# Patient Record
Sex: Female | Born: 1935 | Race: White | Hispanic: No | Marital: Married | Smoking: Never smoker
Health system: Southern US, Community
[De-identification: ages and names within clinical notes are randomized; demographics above are authoritative.]

## PROBLEM LIST (undated history)

## (undated) DIAGNOSIS — F329 Major depressive disorder, single episode, unspecified: Secondary | ICD-10-CM

## (undated) DIAGNOSIS — F32A Depression, unspecified: Secondary | ICD-10-CM

## (undated) DIAGNOSIS — M797 Fibromyalgia: Secondary | ICD-10-CM

## (undated) DIAGNOSIS — I639 Cerebral infarction, unspecified: Secondary | ICD-10-CM

## (undated) DIAGNOSIS — I209 Angina pectoris, unspecified: Secondary | ICD-10-CM

## (undated) DIAGNOSIS — D649 Anemia, unspecified: Secondary | ICD-10-CM

## (undated) DIAGNOSIS — G8929 Other chronic pain: Secondary | ICD-10-CM

## (undated) HISTORY — DX: Fibromyalgia: M79.7

## (undated) HISTORY — PX: OTHER SURGICAL HISTORY: SHX169

## (undated) HISTORY — PX: HERNIA REPAIR: SHX51

## (undated) HISTORY — PX: APPENDECTOMY: SHX54

## (undated) HISTORY — DX: Other chronic pain: G89.29

## (undated) HISTORY — DX: Angina pectoris, unspecified: I20.9

## (undated) HISTORY — DX: Anemia, unspecified: D64.9

## (undated) HISTORY — PX: BLADDER REPAIR: SHX76

## (undated) HISTORY — DX: Major depressive disorder, single episode, unspecified: F32.9

## (undated) HISTORY — DX: Depression, unspecified: F32.A

## (undated) HISTORY — DX: Cerebral infarction, unspecified: I63.9

---

## 2002-11-30 ENCOUNTER — Ambulatory Visit (HOSPITAL_COMMUNITY): Admission: RE | Admit: 2002-11-30 | Discharge: 2002-11-30 | Payer: Self-pay | Admitting: Internal Medicine

## 2003-04-09 ENCOUNTER — Ambulatory Visit (HOSPITAL_COMMUNITY): Admission: RE | Admit: 2003-04-09 | Discharge: 2003-04-09 | Payer: Self-pay | Admitting: Internal Medicine

## 2003-10-11 ENCOUNTER — Ambulatory Visit (HOSPITAL_COMMUNITY): Admission: RE | Admit: 2003-10-11 | Discharge: 2003-10-11 | Payer: Self-pay | Admitting: Internal Medicine

## 2004-05-05 ENCOUNTER — Ambulatory Visit (HOSPITAL_COMMUNITY): Admission: RE | Admit: 2004-05-05 | Discharge: 2004-05-05 | Payer: Self-pay | Admitting: Internal Medicine

## 2004-07-27 HISTORY — PX: ESOPHAGOGASTRODUODENOSCOPY: SHX1529

## 2004-09-09 ENCOUNTER — Ambulatory Visit (HOSPITAL_COMMUNITY): Admission: RE | Admit: 2004-09-09 | Discharge: 2004-09-09 | Payer: Self-pay | Admitting: Internal Medicine

## 2004-09-09 ENCOUNTER — Ambulatory Visit: Payer: Self-pay | Admitting: Internal Medicine

## 2008-10-11 ENCOUNTER — Ambulatory Visit: Payer: Self-pay | Admitting: Internal Medicine

## 2008-10-11 ENCOUNTER — Encounter (INDEPENDENT_AMBULATORY_CARE_PROVIDER_SITE_OTHER): Payer: Self-pay | Admitting: *Deleted

## 2008-10-11 DIAGNOSIS — G8929 Other chronic pain: Secondary | ICD-10-CM

## 2008-10-11 DIAGNOSIS — F329 Major depressive disorder, single episode, unspecified: Secondary | ICD-10-CM

## 2008-10-11 DIAGNOSIS — K5900 Constipation, unspecified: Secondary | ICD-10-CM | POA: Insufficient documentation

## 2008-10-11 DIAGNOSIS — IMO0001 Reserved for inherently not codable concepts without codable children: Secondary | ICD-10-CM | POA: Insufficient documentation

## 2008-10-11 DIAGNOSIS — I6350 Cerebral infarction due to unspecified occlusion or stenosis of unspecified cerebral artery: Secondary | ICD-10-CM | POA: Insufficient documentation

## 2008-10-11 DIAGNOSIS — D649 Anemia, unspecified: Secondary | ICD-10-CM

## 2008-10-11 DIAGNOSIS — R1310 Dysphagia, unspecified: Secondary | ICD-10-CM | POA: Insufficient documentation

## 2008-10-11 DIAGNOSIS — Z8679 Personal history of other diseases of the circulatory system: Secondary | ICD-10-CM | POA: Insufficient documentation

## 2008-10-11 DIAGNOSIS — A0472 Enterocolitis due to Clostridium difficile, not specified as recurrent: Secondary | ICD-10-CM

## 2008-10-19 ENCOUNTER — Telehealth (INDEPENDENT_AMBULATORY_CARE_PROVIDER_SITE_OTHER): Payer: Self-pay | Admitting: *Deleted

## 2008-10-23 ENCOUNTER — Encounter: Payer: Self-pay | Admitting: Internal Medicine

## 2008-11-21 ENCOUNTER — Ambulatory Visit: Payer: Self-pay | Admitting: Internal Medicine

## 2008-11-22 DIAGNOSIS — K219 Gastro-esophageal reflux disease without esophagitis: Secondary | ICD-10-CM | POA: Insufficient documentation

## 2009-10-11 ENCOUNTER — Encounter (INDEPENDENT_AMBULATORY_CARE_PROVIDER_SITE_OTHER): Payer: Self-pay | Admitting: *Deleted

## 2010-05-02 ENCOUNTER — Ambulatory Visit: Payer: Self-pay | Admitting: Internal Medicine

## 2010-05-07 ENCOUNTER — Ambulatory Visit: Payer: Self-pay | Admitting: Internal Medicine

## 2010-05-08 DIAGNOSIS — Z8719 Personal history of other diseases of the digestive system: Secondary | ICD-10-CM

## 2010-05-12 ENCOUNTER — Encounter: Payer: Self-pay | Admitting: Internal Medicine

## 2010-05-23 ENCOUNTER — Ambulatory Visit (HOSPITAL_COMMUNITY): Admission: RE | Admit: 2010-05-23 | Discharge: 2010-05-23 | Payer: Self-pay | Admitting: Internal Medicine

## 2010-05-23 ENCOUNTER — Ambulatory Visit: Payer: Self-pay | Admitting: Internal Medicine

## 2010-06-18 ENCOUNTER — Ambulatory Visit: Payer: Self-pay | Admitting: Internal Medicine

## 2010-08-12 ENCOUNTER — Encounter: Payer: Self-pay | Admitting: Urgent Care

## 2010-08-26 ENCOUNTER — Encounter (INDEPENDENT_AMBULATORY_CARE_PROVIDER_SITE_OTHER): Payer: Self-pay | Admitting: *Deleted

## 2010-08-26 NOTE — Letter (Signed)
Summary: TCS ORDER  TCS ORDER   Imported By: Ave Filter 05/12/2010 11:06:45  _____________________________________________________________________  External Attachment:    Type:   Image     Comment:   External Document

## 2010-08-26 NOTE — Letter (Signed)
Summary: [facility]  [facility]  [address]   [place], [place] [address]   Phone: [phone]  Fax: [phone]    10/11/2009  [facility] [address] [place], [place]  [address] [birth year]  Dear Ms. Cabezas,   Your physician has indicated that:   _______it is time to schedule an appointment.   _______you missed your appointment on______ and need to call and  reschedule.   _______you need to have lab work done.   _______you need to schedule an appointment to discuss lab or test results.   _______you need to call to reschedule your appointment that was scheduled on _________.   Please call our office at  [phone].    Thank you,    [facility] Ph: [phone]   Fax: [phone]

## 2010-08-26 NOTE — Assessment & Plan Note (Signed)
Summary: HAVING BOWEL PROBLEMS/LAW   Visit Type:  Initial Visit Primary Care Johntavious Francom:  Shelia [name]  Chief Complaint:  constipation.  History of Present Illness: Followup chronic constipation. Last seen a little over a one year ago. Has been maintained on MiraLax. Polypharmacy is contributing to constipation. Since I last saw this nice lady, she underwent revision/repair of her ventral hernia and mesh removed up in [place]. She's doing well from that procedure. When she gets constipated may go  2-3 or 4 days without a bowel movement before  taking MiraLax takes it for few days; has  a "blowout" /  diarrhea.  She has had no  rectal bleeding last colonoscopy 2005; at that time,  she had an evaluation for diarrhea found have C. difficile and left-sided diverticula. No history of colon polyps or colon cancer in her family. She due for routine screening 2015. Has any upper GI tract symptoms such as reflux nausea vomiting or abdominal pain.  Current Problems (verified): 1)  Gerd  (ICD-530.81) 2)  Fibromyalgia  (ICD-729.1) 3)  Hx of Cva  (ICD-434.91) 4)  Other Chronic Pain  (ICD-338.29) 5)  Depression  (ICD-311) 6)  Angina, Hx of  (ICD-V12.50) 7)  Anemia  (ICD-285.9) 8)  Constipation  (ICD-564.00) 9)  Dysphagia Unspecified  (ICD-787.20) 10)  Clostridium Difficile Colitis  (ICD-008.45)  Current Medications (verified): 1)  Bayer Aspirin 325 Mg .... Once Daily 2)  Clonazepam Odt 0.5 Mg Tbdp (Clonazepam) .... Take 1/2 Tablet in The Am and 2 Tablets in The Pm 3)  Amitriptyline Hcl 100 Mg Tabs (Amitriptyline Hcl) .... At Bedtime 4)  Synthroid 88 Mcg Tabs (Levothyroxine Sodium) .... Once Daily 5)  B 12 Injection .... Once Monthly 6)  Methadone Hcl 10 Mg Tabs (Methadone Hcl) .... Three Times A Day 7)  Pravachol 40 Mg Tabs (Pravastatin Sodium) .... Once Daily 8)  Enalapril Maleate 5 Mg Tabs (Enalapril Maleate) .... Once Daily 9)  Meloxicam 15 Mg Tabs (Meloxicam) .... Two Times A Day 10)   Voltaren 1 % Gel (Diclofenac Sodium) .... Four Times A Day 11)  Miralax .... As Needed 12)  Zantac 150 Mg Tabs (Ranitidine Hcl) .... Once Daily  Allergies (verified): 1)  ! Prozac 2)  ! Nubain 3)  ! Vioxx 4)  ! Lyrica 5)  ! 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  Alcohol Use - yes  Risk Factors: Smoking Status: never (October 25, 2008)  Past Medical History: Anemia Angina Depression Chronic Pain 2 minor CVA'S Fibromyalgia egd 2006  Vital Signs:  Patient profile:   75 year old female Height:      64 inches Weight:      153 pounds BMI:     26.36 Temp:     98.2 degrees F oral Pulse rate:   64 / minute BP sitting:   110 / 70  (left arm) Cuff size:   regular  Vitals Entered By: Hendricks Limes LPN (May 02, 2010 9:42 AM)  Physical Exam  General:  alert conversant no acute distress couldn't be by her husband. Abdomen:  flat well-healed midline surgical scar positive bowel sounds soft nontender without appreciable mass or organomegaly  Impression & Recommendations: Impression: 75 year old lady with chronic  constipation the setting of polypharmacy. The way she's taking MiraLax needs to be adjusted so she will have fewer wide swings in bowel function.  Recommendations: MiraLax 17 g orally nightly on any given day without a bowel movement; if she has a bowel movement on any given day she should leave at MiraLax off.. She is to keep a stool diary. This does not improve bowel function within the next month she is to let me know. Otherwise, we'll plan see her on a p.r.n. basis.  Scheduled routine screening colonoscopy  2015.  We'll also have her return one fecal occult blood test sample.  Appended Document: Orders Update    Clinical Lists Changes  Problems: Added new problem of DIVERTICULITIS, HX OF (ICD-V12.79) Orders: Added new Service order of Est. Patient Level III (60630) - Signed

## 2010-08-26 NOTE — Assessment & Plan Note (Signed)
Summary: 4WK F/U PER RMR/LAW   Visit Type:  Follow-up Visit Primary Care Provider:  Majel Homer  Chief Complaint:  F/U.  History of Present Illness: Followup. Chronic constipation/obstipation. Recent colonoscopy demonstrated a long tortuous colon with left-sided diverticula. We started Amitiza  micrograms p.o. B.i.d..  She had 6 bowel movements last week; took one dose of MiraLax and som prune juice once. Feels her bowels finally started to move about 2 weeks ago.. No side effects whatsoever with Amitiza at this point in time. She is very pleased with herprogress.     Current Medications (verified): 1)  Bayer Aspirin 325 Mg .... Once Daily 2)  Clonazepam Odt 0.5 Mg Tbdp (Clonazepam) .... Take 1/2 Tablet in The Am and 2 Tablets in The Pm 3)  Amitriptyline Hcl 100 Mg Tabs (Amitriptyline Hcl) .... At Bedtime 4)  Synthroid 88 Mcg Tabs (Levothyroxine Sodium) .... Once Daily 5)  B 12 Injection .... Once Monthly 6)  Methadone Hcl 10 Mg Tabs (Methadone Hcl) .... Three Times A Day 7)  Pravachol 40 Mg Tabs (Pravastatin Sodium) .... Once Daily 8)  Enalapril Maleate 5 Mg Tabs (Enalapril Maleate) .... Once Daily 9)  Meloxicam 15 Mg Tabs (Meloxicam) .... Two Times A Day 10)  Voltaren 1 % Gel (Diclofenac Sodium) .... Four Times A Day 11)  Miralax .... As Needed 12)  Zantac 150 Mg Tabs (Ranitidine Hcl) .... Once Daily 13)  Zolpidem Tartrate 5 Mg Tabs (Zolpidem Tartrate) .... Take 1 Tab By Mouth At Bedtime 14)  Tizanedine 2mg  .... Take 1 Tablet By Mouth Once A Day 15)  Nitrostat 0.4 Mg Subl (Nitroglycerin) .... As Needed 16)  Amitiza 8 Mcg Caps (Lubiprostone) .... Take 1 Tablet By Mouth Two Times A Day  Allergies (verified): 1)  ! Prozac 2)  ! Nubain 3)  ! Vioxx 4)  ! Lyrica 5)  ! 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 We have now made major strides in combating her constipation. We do have some room to increase the  dose of Amitiza but will see how she does over the next 2 weeks contemplating a change in her current regimen. Recommendations: Continue Amitiza 8 micrograms orally b.i.d. during meals. May use MiraLax on top of Amitiza if needed.  2 week telephone progress report. Otherwise, plan see this nice lady back in 3 months.  Appended Document: Orders Update  Clinical Lists Changes  Orders: Added new Service order of Est. Patient Level III (40102) - Signed

## 2010-08-26 NOTE — Assessment & Plan Note (Signed)
Summary: dropped off stool/ss   Allergies: 1)  ! Prozac 2)  ! Nubain 3)  ! Vioxx 4)  ! Lyrica 5)  ! * Cymbalta  Other Orders: Immuno-chemical Fecal Occult (40981)   One iFOBT returned and it was positive.  Appended Document: dropped off stool/ss pt needs a tcs now for positive ifobt  Appended Document: dropped off stool/ss Pt scheduled for 05/23/10@1 :15..Pt aware and instructions mailed.

## 2010-08-28 NOTE — Medication Information (Signed)
Summary: AMITIZA   AMITIZA   Imported By: Rexene Alberts 08/12/2010 11:30:56  _____________________________________________________________________  External Attachment:    Type:   Image     Comment:   External Document  Appended Document: AMITIZA     Prescriptions: AMITIZA 8 MCG CAPS (LUBIPROSTONE) Take 1 tablet by mouth two times a day  #60 x 5   Entered and Authorized by:   Joselyn Arrow FNP-BC   Signed by:   Joselyn Arrow FNP-BC on 08/12/2010   Method used:   Electronically to        [facility]* (retail)       [address]       [place], [place]  [address]       Ph: [phone]       Fax: [phone]   RxID:   [id number]

## 2010-09-03 NOTE — Letter (Signed)
Summary: Recall Office Visit  [facility]  [address]   [place], [place] [address]   Phone: [phone]  Fax: [phone]      August 26, 2010   [facility] [address] [place], [place]  [address] 23-Oct-1935   Dear Ms. Hakanson,   According to our records, it is time for you to schedule a follow-up office visit with [place].   At your convenience, please call [phone] to schedule an office visit. If you have any questions, concerns, or feel that this letter is in error, we would appreciate your call.   Sincerely,    Diana Eves  [facility] Ph: [phone]   Fax: [phone]

## 2010-10-09 ENCOUNTER — Ambulatory Visit (INDEPENDENT_AMBULATORY_CARE_PROVIDER_SITE_OTHER): Payer: Medicare Other | Admitting: Gastroenterology

## 2010-10-09 ENCOUNTER — Encounter: Payer: Self-pay | Admitting: Gastroenterology

## 2010-10-09 DIAGNOSIS — K921 Melena: Secondary | ICD-10-CM

## 2010-10-09 DIAGNOSIS — K5900 Constipation, unspecified: Secondary | ICD-10-CM

## 2010-10-10 ENCOUNTER — Encounter: Payer: Self-pay | Admitting: Internal Medicine

## 2010-10-10 LAB — CONVERTED CEMR LAB
Basophils Absolute: 0 10*3/uL (ref 0.0–0.1)
Basophils Relative: 1 % (ref 0–1)
Eosinophils Absolute: 0.2 10*3/uL (ref 0.0–0.7)
MCHC: 31.7 g/dL (ref 30.0–36.0)
MCV: 85.7 fL (ref 78.0–100.0)
Neutrophils Relative %: 56 % (ref 43–77)
Platelets: 179 10*3/uL (ref 150–400)
RDW: 13.5 % (ref 11.5–15.5)

## 2010-10-13 ENCOUNTER — Encounter (HOSPITAL_COMMUNITY): Payer: Medicare Other | Attending: Internal Medicine

## 2010-10-13 ENCOUNTER — Other Ambulatory Visit: Payer: Self-pay | Admitting: Internal Medicine

## 2010-10-13 LAB — BASIC METABOLIC PANEL
BUN: 21 mg/dL (ref 6–23)
CO2: 25 mEq/L (ref 19–32)
Calcium: 9.6 mg/dL (ref 8.4–10.5)
Chloride: 103 mEq/L (ref 96–112)
Creatinine, Ser: 1.29 mg/dL — ABNORMAL HIGH (ref 0.4–1.2)

## 2010-10-14 ENCOUNTER — Telehealth: Payer: Self-pay | Admitting: Urgent Care

## 2010-10-14 ENCOUNTER — Other Ambulatory Visit: Payer: Self-pay | Admitting: Internal Medicine

## 2010-10-14 ENCOUNTER — Encounter (HOSPITAL_COMMUNITY): Payer: Medicare Other

## 2010-10-14 DIAGNOSIS — Z01812 Encounter for preprocedural laboratory examination: Secondary | ICD-10-CM | POA: Insufficient documentation

## 2010-10-14 DIAGNOSIS — Z01810 Encounter for preprocedural cardiovascular examination: Secondary | ICD-10-CM | POA: Insufficient documentation

## 2010-10-14 DIAGNOSIS — D62 Acute posthemorrhagic anemia: Secondary | ICD-10-CM

## 2010-10-14 LAB — BASIC METABOLIC PANEL
Calcium: 9.2 mg/dL (ref 8.4–10.5)
GFR calc Af Amer: 45 mL/min — ABNORMAL LOW (ref >60)
GFR calc non Af Amer: 38 mL/min — ABNORMAL LOW (ref >60)
Glucose, Bld: 96 mg/dL (ref 70–99)
Sodium: 136 mEq/L (ref 135–145)

## 2010-10-14 NOTE — Assessment & Plan Note (Signed)
Summary: diarrhea   Vital Signs:  Patient profile:   75 year old female Height:      64 inches Weight:      161.50 pounds BMI:     27.82 Temp:     98.1 degrees F oral Pulse rate:   80 / minute BP sitting:   148 / 70  (left arm)  Vitals Entered By: Carolan Clines LPN (October 09, 2010 1:22 PM)  Visit Type:  f/u  Chief Complaint:  constipation.  History of Present Illness: Patient here for f/u constipation/obstipation. At last OV, she was doing well on Amitiza two times a day but now she states it didn't really work. She took it with Miralax and Colon Cleanse recently and then had several days of incontrollable nonbloody diarrhea. She had fecal incontinence at night. No medications for BMs over the last one week and now hasn't had BM for four days. Denies abd pain, n/v. Kept stool diary at home but didn't bring it today.      .   Current Medications (verified): 1)  Bayer Aspirin 325 Mg .... Once Daily 2)  Clonazepam Odt 0.5 Mg Tbdp (Clonazepam) .... Take 1/2 Tablet in The Am and 2 Tablets in The Pm 3)  Amitriptyline Hcl 100 Mg Tabs (Amitriptyline Hcl) .... At Bedtime 4)  Synthroid 88 Mcg Tabs (Levothyroxine Sodium) .... Once Daily 5)  B 12 Injection .... Once Monthly 6)  Methadone Hcl 10 Mg Tabs (Methadone Hcl) .... Three Times A Day 7)  Pravachol 40 Mg Tabs (Pravastatin Sodium) .... Once Daily 8)  Enalapril Maleate 5 Mg Tabs (Enalapril Maleate) .... Once Daily 9)  Meloxicam 15 Mg Tabs (Meloxicam) .... Two Times A Day 10)  Voltaren 1 % Gel (Diclofenac Sodium) .... Four Times A Day 11)  Miralax .... As Needed 12)  Zantac 150 Mg Tabs (Ranitidine Hcl) .... Once Daily 13)  Zolpidem Tartrate 5 Mg Tabs (Zolpidem Tartrate) .... Take 1 Tab By Mouth At Bedtime 14)  Tizanedine 2mg  .... Take 1 Tablet By Mouth Once A Day 15)  Nitrostat 0.4 Mg Subl (Nitroglycerin) .... As Needed  Allergies: 1)  ! Prozac 2)  ! Nubain 3)  ! Vioxx 4)  ! Lyrica 5)  ! * Cymbalta  Review of Systems  See HPI  Physical Exam  General:  Well developed, well nourished, no acute distress. Head:  Normocephalic and atraumatic. Eyes:  sclera nonicteric Mouth:  op moist Abdomen:  Bowel sounds normal.  Abdomen is soft, nontender, nondistended.  No rebound or guarding.  No hepatosplenomegaly, masses or hernias.  No abdominal bruits.  Extremities:  No clubbing, cyanosis, edema or deformities noted. Neurologic:  Alert and  oriented x4;  grossly normal neurologically. Skin:  Intact without significant lesions or rashes. Psych:  Alert and cooperative. Normal mood and affect.   Impression & Recommendations:  Problem # 1:  CONSTIPATION (ICD-564.00) Chronic constipation with recent uncontrollable diarrhea after consuming numerous laxatives. C/O black stool but did use some PeptoBismol at some point. Patient not sure if took it before the black stools occured. No evidence of significant anemia on exam. No BM in four days.   See pt intstructions for new bowel regimen. She has been off meds for one week and we will start with a clean slate. Restart Miralax. They will call if adjustments needed. Keep stool diary. Orders: Est. Patient Level II (47829)  Other Orders: T-CBC w/Diff ([id number])  Patient Instructions: 1)  Take Miralax one capful at bedtime on days  you have not had a BM.  2)  Keep stool diary. 3)  Get labs done. 4)  Call if still having constipation and you need [place] to help you adjust your dosing of Miralax. 5)  Add Fiberchoice two tablets daily. 6)  Drink at least eight glasses of water daily. 7)  The medication list was reviewed and reconciled.  All changed / newly prescribed medications were explained.  A complete medication list was provided to the patient / caregiver.   Orders Added: 1)  T-CBC w/Diff [[id number]] 2)  Est. Patient Level II [09811]

## 2010-10-14 NOTE — Telephone Encounter (Signed)
Pt schedule to see Dr Nelson Chimes who is covering for Dr Clancy Gourd cannon.  Patient aware of appt

## 2010-10-14 NOTE — Telephone Encounter (Signed)
Spoke w/ Dr Jena Gauss.  He cancelled procedures today.  Wants pt seen ASAP for hyperkalemia & renal insufficiency prior to rescheduling.  Discussed w/ pt & pt's husband. They agree.  PCP Dr Blake Divine, [place] [phone].

## 2010-10-15 ENCOUNTER — Ambulatory Visit (HOSPITAL_COMMUNITY): Admission: RE | Admit: 2010-10-15 | Payer: Medicare Other | Source: Ambulatory Visit | Admitting: Internal Medicine

## 2010-10-15 ENCOUNTER — Encounter: Payer: Medicare Other | Admitting: Internal Medicine

## 2010-10-20 NOTE — Telephone Encounter (Signed)
Patient wanting to have her hemoglobin and hematocrit rechecked. We are still waiting to schedule her for her EGD after she sees her PCP for hyperkalemia and renal insufficiency.    Please see if we can get last office visit from Dr. Ellison Carwin office regarding her hyperkalemia and renal insufficiency.

## 2010-10-20 NOTE — Telephone Encounter (Signed)
Addended by: Tana Coast on: 10/20/2010 12:32 PM   Modules accepted: Orders

## 2010-10-23 NOTE — Letter (Signed)
Summary: Internal Other  Internal Other   Imported By: Ave Filter 10/10/2010 13:25:05  _____________________________________________________________________  External Attachment:    Type:   Image     Comment:   External Document

## 2010-10-24 NOTE — Telephone Encounter (Signed)
Pt going back to PCP next week, will have them send a copy of labs to [place].

## 2010-10-28 NOTE — Telephone Encounter (Signed)
Labs faxed to PCP

## 2010-11-05 NOTE — Telephone Encounter (Signed)
Per LW- labs on [facility] desk.

## 2010-11-05 NOTE — Telephone Encounter (Signed)
Please find out status. We are still waiting for her repeat labs done at her PCP so that we can schedule her for procedures.

## 2010-11-06 ENCOUNTER — Encounter: Payer: Self-pay | Admitting: Gastroenterology

## 2010-11-06 LAB — BASIC METABOLIC PANEL: Glucose: 99

## 2010-11-06 LAB — CBC WITH DIFFERENTIAL/PLATELET
HCT: 31 %
Hemoglobin: 10.7 g/dL — AB (ref 12.0–16.0)
platelet count: 166000

## 2010-11-07 ENCOUNTER — Telehealth: Payer: Self-pay | Admitting: Internal Medicine

## 2010-11-07 DIAGNOSIS — D649 Anemia, unspecified: Secondary | ICD-10-CM

## 2010-11-07 DIAGNOSIS — K921 Melena: Secondary | ICD-10-CM

## 2010-11-07 NOTE — Telephone Encounter (Signed)
Gastroenterology Pre-Procedure Form  Request Date: 11/10/2010,       PATIENT INFORMATION:  Shelia [name] is a 75 y.o., female (DOB=08/05/1935).  PROCEDURE: Procedure(s) requested: endoscopy Procedure Reason: melena,anemia  PATIENT REVIEW QUESTIONS: The patient reports the following:   1. Diabetes Melitis: no 2. Joint replacements in the past 12 months: no 3. Major health problems in the past 3 months: no 4. Has an artificial valve or MVP:no 5. Has been advised in past to take antibiotics in advance of a procedure like teeth cleaning: no}    MEDICATIONS & ALLERGIES:    Patient reports the following regarding taking any blood thinners:   Plavix? no Aspirin?yes 325mg  Coumadin?  no  Patient confirms/reports the following medications:  Current Outpatient Prescriptions  Medication Sig Dispense Refill  . amitriptyline (ELAVIL) 100 MG tablet Take 100 mg by mouth at bedtime.        Marland Kitchen aspirin 325 MG tablet Take 325 mg by mouth daily.        . clonazePAM (KLONOPIN) 0.5 MG tablet Take 0.5 mg by mouth. TAKE A 1/2 A PILL IN THE AM AND 2 TABLETS IN THE PM       . Cyanocobalamin (B-12 SL) Inject into the skin every 30 (thirty) days.        . diclofenac (VOLTAREN) 0.1 % ophthalmic solution 1 drop 4 (four) times daily.        . enalapril (VASOTEC) 5 MG tablet Take 5 mg by mouth daily.        Marland Kitchen levothyroxine (SYNTHROID, LEVOTHROID) 88 MCG tablet Take 88 mcg by mouth daily.        . meloxicam (MOBIC) 15 MG tablet Take 15 mg by mouth daily.        . methadone (DOLOPHINE) 10 MG tablet Take 10 mg by mouth every 8 (eight) hours.        . nitroGLYCERIN (NITROSTAT) 0.4 MG SL tablet Place 0.4 mg under the tongue as needed.        . polyethylene glycol (MIRALAX / GLYCOLAX) packet Take 17 g by mouth as needed.        . pravastatin (PRAVACHOL) 40 MG tablet Take 40 mg by mouth daily.        . ranitidine (ZANTAC) 150 MG capsule Take 150 mg by mouth daily.        . tizanidine (ZANAFLEX) 2 MG capsule Take  2 mg by mouth daily.        Marland Kitchen zolpidem (AMBIEN) 5 MG tablet Take 5 mg by mouth at bedtime as needed.          Patient confirms/reports the following allergies:  Allergies  Allergen Reactions  . Duloxetine   . Fluoxetine Hcl   . Nalbuphine   . Pregabalin   . Rofecoxib     Patient is appropriate to schedule for requested procedure(s): yes    Orders Placed This Encounter  Procedures  . EGD    Standing Status: Future     Number of Occurrences:      Standing Expiration Date: 11/07/2011    Order Specific Question:  Pre-op diagnosis    Answer:  [facility]    Order Specific Question:  Pre-op visit required?    Answer:  Yes [1]    SCHEDULE INFORMATION: Procedure has been scheduled as follows:  Date: 11/27/2010, Time: given at pre-op  Location: [facility]ORT STAY  This Gastroenterology Pre-Precedure Form is being routed to the following provider(s) for review [facility].

## 2010-11-07 NOTE — Telephone Encounter (Signed)
Okay to schedule EGD. No change in medications. Please make sure EGD is scheduled with deep sedation with propofol due to polypharmacy and history of inadequate conscious sedation.

## 2010-11-10 NOTE — Telephone Encounter (Signed)
Instructions placed in the mail 

## 2010-11-21 ENCOUNTER — Other Ambulatory Visit: Payer: Self-pay | Admitting: Internal Medicine

## 2010-11-21 ENCOUNTER — Encounter (HOSPITAL_COMMUNITY): Payer: Medicare Other

## 2010-11-21 LAB — BASIC METABOLIC PANEL
CO2: 27 mEq/L (ref 19–32)
Calcium: 9.4 mg/dL (ref 8.4–10.5)
GFR calc Af Amer: >60 mL/min (ref >60)
GFR calc non Af Amer: >60 mL/min (ref >60)
Sodium: 137 mEq/L (ref 135–145)

## 2010-11-27 ENCOUNTER — Ambulatory Visit (HOSPITAL_COMMUNITY)
Admission: RE | Admit: 2010-11-27 | Discharge: 2010-11-27 | Disposition: A | Discharge status: Home / Self Care | Payer: Medicare Other | Source: Ambulatory Visit | Attending: Internal Medicine | Admitting: Internal Medicine

## 2010-11-27 ENCOUNTER — Encounter: Payer: Medicare Other | Admitting: Internal Medicine

## 2010-11-27 DIAGNOSIS — K921 Melena: Secondary | ICD-10-CM | POA: Insufficient documentation

## 2010-11-27 DIAGNOSIS — D131 Benign neoplasm of stomach: Secondary | ICD-10-CM | POA: Insufficient documentation

## 2010-11-27 DIAGNOSIS — Z01812 Encounter for preprocedural laboratory examination: Secondary | ICD-10-CM | POA: Insufficient documentation

## 2010-11-27 DIAGNOSIS — Z01818 Encounter for other preprocedural examination: Secondary | ICD-10-CM | POA: Insufficient documentation

## 2010-11-27 DIAGNOSIS — R1310 Dysphagia, unspecified: Secondary | ICD-10-CM

## 2010-12-02 NOTE — Op Note (Signed)
  Shelia [name], Shelia [name]             ACCOUNT NO.:  [id number]  MEDICAL RECORD NO.:  [id number]           PATIENT TYPE:  O  LOCATION:  [facility]                          FACILITY:  [facility]  PHYSICIAN:  R. Roetta Sessions, M.D. DATE OF BIRTH:  08/23/1935  DATE OF PROCEDURE:  11/27/2010 DATE OF DISCHARGE:                              OPERATIVE REPORT   INDICATIONS FOR PROCEDURE:  A 75 year old lady who recently experienced bout of melena, dropped her hemoglobin, temporally related to Mobic therapy.  She was slated for EGD with hyperkalemia and transient renal insufficiency which resolved.  Followup hemoglobin 11.  She is no longer on Mobic, now taking Nexium.  She also relates intermittent esophageal dysphagia to solids, previous history of esophageal dilation with good results.  EGD is now being done.  Risks, benefits, limitations, alternatives, and imponderables have been discussed, questions answered. Please see the documentation in the medical record.  Because of polypharmacy and difficulties with sedation previously, she is now being done under deep sedation with the help of Dr. Jayme Cloud and associates.  PROCEDURE NOTE:  The patient was placed in the left lateral decubitus position.  O2 saturation, blood pressure, pulse, and respirations were monitored throughout entirety of the procedure.  CONSCIOUS SEDATION:  Propofol per Dr. Jayme Cloud and associates. Cetacaine spray for topical pharyngeal anesthesia.  FINDINGS:  Examination of the tubular esophagus revealed some mild subtle subjective diffuse narrowing at the distal one third of the esophagus.  There was no feline appearance or mucosal inflammation. There was no discrete stricture.  EG junction was easily traversed. Stomach:  Gastric cavity was emptied and insufflated well with air. Thorough examination of the gastric mucosa including retroflex of the proximal stomach, esophagogastric junction demonstrated a couple of tiny fundal gland  polyps, not manipulated.  Pylorus was patent and easily traversed.  Examination of the bulb second portion revealed no abnormalities.  THERAPEUTIC/DIAGNOSTIC MANEUVERS PERFORMED:  Scope was withdrawn.  A 56- French Maloney dilator was passed to full insertion.  A look back revealed some mucosal disruption just above the GE junction, really not much the way being significant tears, minimal bleeding.  The patient tolerated the procedure well.  IMPRESSION: 1. Mild noncritical narrowing of the distal esophagus as described     above status post passage of the [facility] dilator. 2. Tiny fundal gland type polyps, otherwise normal stomach, D1 and D2. 3. I suspect recent gastrointestinal insult secondary to Mobic with     self-limiting gastrointestinal bleed.  RECOMMENDATIONS:  Continue Nexium 40 mg orally daily.  PLAN:  I will see her back in the office later this year.     Jonathon Bellows, M.D.     RMR/MEDQ  D:  11/27/2010  T:  11/27/2010  Job:  [id number]  cc:   Dr. Constance Goltz, [place]  Electronically Signed by Lorrin Goodell M.D. on 12/02/2010 10:52:15 AM

## 2010-12-08 ENCOUNTER — Encounter: Payer: Self-pay | Admitting: Internal Medicine

## 2010-12-12 NOTE — Op Note (Signed)
NAME:  Shelia [name], Shelia [name]                       ACCOUNT NO.:  [id number]   MEDICAL RECORD NO.:  [id number]                   PATIENT TYPE:  AMB   LOCATION:  DAY                                  FACILITY:  [facility]   PHYSICIAN:  R. Roetta Sessions, M.D.              DATE OF BIRTH:  1936/06/29   DATE OF PROCEDURE:  DATE OF DISCHARGE:                                 OPERATIVE REPORT   PROCEDURE:  Esophagogastroduodenoscopy with Elease Hashimoto dilation followed by  biopsy.   ENDOSCOPIST:  Gerrit Friends. Rourk, M.D.   INDICATIONS FOR PROCEDURE:  The patient is [name] with [name] history  of esophageal dysphagia secondary to peptic stricture.  She underwent  EGD  back on Nov 30, 2002.  She was found to have [name] food impaction as well.  The  stricture appeared benign. She was dilated up to [name] 56 [place] Maloney size;  however, she has had recurrent esophageal dysphagia even though she has been  on Aciphex on [name] regular basis.  EGD is now being done to evaluate recurrent  dysphagia.  This approach has been discussed with the patient previously.  Please see my March 29, 2003 H&P.   PROCEDURE NOTE:  O2 saturation, blood pressure, pulse and respirations were  monitored throughout the entire procedure. Conscious sedation: Versed 4 mg  IV, Demerol 100 mg IV in divided doses.   INSTRUMENT:  Olympus video chip adult gastroscope.   FINDINGS:  Examination of the tubular esophagus revealed soft stricturing of  the distal esophagus.  There was some whitish overlying plaques which would  not rub off.  These were localized and did not appear to be representative  of Candida.  There was no obvious Barrett's esophagus or any neoplastic  process. The EG junction was easily traversed with the scope.   STOMACH:  The gastric cavity was empty.  It insufflated well with air.  [name]  thorough examination of the gastric mucosa including [name] retroflex view of the  proximal stomach and esophagogastric junction demonstrated no  abnormalities.  The pylorus was patent and easily traversed.   DUODENUM:  The bulb and the second portion appeared normal.   THERAPEUTIC/DIAGNOSTIC MANEUVERS:  [name] 56 French Maloney dilator was passed to  full insertion with slight resistance.  [name] look back revealed effective  dilation of this segment with some bleeding.  I did not feel that it was  appropriate to attempt [name] larger bore dilation at this setting.  Subsequently  the area in question with the whitish plaques were biopsied for histologic  study.   The patient tolerated the procedure well and was reacted in endoscopy.   IMPRESSION:  1. [name] soft, benign appearing peptic stricturing of the distal esophagus with     some whitish mucosal areas of uncertain significance that were adherent.     Otherwise normal stomach.  2. Normal D1 and D2.  3. Status post Maloney dilation as  described above, followed by biopsy.   RECOMMENDATIONS:  1. Continue Aciphex 20 mg orally daily.  2. Follow up on path.  3. Further recommendations to follow.                                               Jonathon Bellows, M.D.    RMR/MEDQ  D:  04/09/2003  T:  04/09/2003  Job:  [id number]   cc:   Majel Homer, M.D.  FAX # [phone]

## 2010-12-12 NOTE — Consult Note (Signed)
NAME:  [name], Shelia A.                      ACCOUNT NO.:  [id number]   MEDICAL RECORD NO.:  [id number]                   PATIENT TYPE:  AMB   LOCATION:                                       FACILITY:  [facility]   PHYSICIAN:  R. Roetta Sessions, M.D.              DATE OF BIRTH:  1936-06-01   DATE OF CONSULTATION:  DATE OF DISCHARGE:                                   CONSULTATION   REFERRING PHYSICIAN:  Majel Homer, M.D.   REASON FOR CONSULTATION:  Constipation.  Esophageal dysphasia.   HISTORY OF PRESENT ILLNESS:  The patient is a pleasant 75 year old lady who  is sent by the courtesy of Dr. Majel Homer in [place], [place] to  further evaluate and manage constipation.  The patient describes being  constipated a good part of her life.  It has gotten more severe the past  couple of years.  She may go upwards to two weeks without having a bowel  movement.  She has tried a variety of laxatives, OTC and prescriptions  including lactulose and MiraLax with little overall benefit.  She frequently  takes enemas to have a bowel movement.  She reports being disimpacted in Dr.  John Giovanni office recently.  She also occasionally passes some blood per  rectum.  She has had a couple of colonoscopies in the past, the last being a  number of years ago by Dr. Clearence Cheek in [place].  In between bowel movements  she gets bloated and has diffuse upper abdominal pain.  She rarely gets a  strong urge to actually have a bowel movement, however. She does  occasionally have periods of diarrhea punctuating long periods of  constipation.  She has not lost any weight recently.   She has a history of colorectal neoplasia.  She was diagnosed with  hypothyroidism one year ago and has been on supplementation ever since.   She also complains of intermittent esophageal dysphasia to solids.  She has  occasional reflux symptoms for which she has been started on Prilosec 20 mg  orally daily.  She has never had  her upper GI tract evaluated.   PAST MEDICAL HISTORY:  1. History of angina fibromyalgia.  2. History of TIAs previously.  3. History of anemia and arthritis.   PAST SURGICAL HISTORY:  1. Appendectomy.  2. Hysterectomy.  3. Bladder repair x2.  4. Some type of abdominal wall or bowel reconstruction previously.  5. Cholecystectomy.  6. Biopsy for benign breast disease on the right.   CURRENT MEDICATIONS:  1. Amitriptyline 100 mg q.h.s.  2. Plavix 75 mg __________.  3. Aspirin 325 mg daily.  4. Synthroid 88 mcg daily.  5. Nitrostat 0.4 mg p.r.n.  6. Clonazepam 0.5 mg t.i.d.   ALLERGIES:  PROZAC, NUBAIN - hallucinations, nervousness and shakiness.   FAMILY HISTORY:  Her father died with prostate cancer.  Mother succumbed to  old age.  No history of  chronic GI or biliary disease.   SOCIAL HISTORY:  She is married for 45 years.  Six children.  She is a  housewife.  No tobacco or alcohol.   REVIEW OF SYSTEMS:  As in history of present illness.  No fever, chills, or  change in weight.  No chest pain or dyspnea.   PHYSICAL EXAMINATION:  GENERAL:  This is a pleasant 75 year old lady resting  comfortably accompanied by her husband.  VITAL SIGNS:  Weight 171, height 5 feet 4 inches, temperature 98.5, BP  110/70, pulse of 98.  SKIN:  Warm and dry.  There is no jaundice and no stigmata of chronic liver  disease.  HEENT:  No scleral icterus.  Conjunctivae are pink.  Oral cavity no lesions.  EXTREMITIES:  Not prominent.  CHEST:  Lungs are clear to auscultation.  CARDIAC:  Regular rate and rhythm without murmurs, rubs, or gallops.  BREASTS:  Deferred.  ABDOMEN:  Nondistended.  Obese.  Positive bowel sounds.  She has mild  diffuse abdominal tenderness.  No appreciable mass or organomegaly.  RECTAL:  Deferred on account of colonoscopy.   IMPRESSION:  The patient is a pleasant 75 year old lady with long standing  constipation punctuated by periods of diarrhea.  She most likely has   constipation or predominant irritable bowel syndrome.   She is on at least one medication (IE, amitriptyline) which could be  contributing to her constipation.  We need to make sure that she is U  thyroid and that her serum calcium is normal.  She has failed to respond in  any meaningful way to a variety of OTC and prescription laxatives.   She also complains of intermittent esophageal dysphasia in a background of  chronic gastroesophageal reflux disease symptoms.  She has asked that she be  evaluated for that as well.   RECOMMENDATIONS:  To further evaluate her hematochezia, I have offered the  patient a colonoscopy in the near future at [facility].  At the  same time we will offer an EGD with esophageal dilatation as appropriate.  This approach has been discussed with the patient and her husband at some  length.  Additional risks,  benefits, and alternatives have been reviewed and questions answered.  We  will also take the liberty of checking a serum TSH and calcium today.  Further recommendations to follow.   As always, I would like to thank Majel Homer for letting me see this nice  lady today.                                               Jonathon Bellows, M.D.    RMR/MEDQ  D:  11/23/2002  T:  11/23/2002  Job:  [id number]   cc:   Jake Church  [address]  [place]  [place] [address]  Fax: [phone]

## 2010-12-12 NOTE — Op Note (Signed)
Shelia [name], Shelia [name]             ACCOUNT NO.:  [id number]   MEDICAL RECORD NO.:  [id number]          PATIENT TYPE:  AMB   LOCATION:  DAY                           FACILITY:  [facility]   PHYSICIAN:  R. Roetta Sessions, M.D. DATE OF BIRTH:  November 03, 1935   DATE OF PROCEDURE:  05/05/2004  DATE OF DISCHARGE:                                 OPERATIVE REPORT   PROCEDURE:  Esophagogastroduodenoscopy with Elease Hashimoto dilation.   INDICATION FOR PROCEDURE:  The patient is a 75 year old lady with  gastroesophageal reflux disease with a recent recurrent esophageal  dysphagia.  She is felt to have a smoothly-marginated stricture at the EG  junction on prior barium pill esophagogram.  She comes for EGD with  esophageal dilation as appropriate.  This approach has been discussed with  the patient at length.  The potential risks, benefits, and alternatives have  been reviewed, questions answered.  She is agreeable.  Please see  documentation in the medical record.  Please see my dictated office note.   PROCEDURE NOTE:  O2 saturation, blood pressure, pulse, and respirations were  monitored throughout the entire procedure.   CONSCIOUS SEDATION:  Versed 9 mg IV, Demerol 200 mg IV in divided doses.   INSTRUMENT USED:  Olympus video chip system.   FINDINGS:  Examination of the tubular esophagus revealed some smooth  narrowing at the EG junction.  I did not detect a discrete stricture or  ring.  There was no esophagitis, evidence of Barrett's esophagus, etc.  The  EG junction was easily traversed with the scope.   Stomach:  Gastric cavity was empty and insufflated well with air.  A  thorough examination of the gastric mucosa including retroflexed view of the  proximal stomach and esophagogastric junction demonstrated no abnormalities.  The pylorus was patent and easily traversed.  Examination of the bulb and  second portion revealed no abnormalities.   Therapeutic/diagnostic maneuvers performed:  A 56 French  Maloney dilator was  passed to full insertion with good patient tolerance.  A look back revealed  no apparent complication related to passage of the dilator.  The patient  tolerated the procedure well and was reacted in endoscopy.   IMPRESSION:  1.  Smooth, mild narrowing of the distal esophagus of uncertain      significance, otherwise normal mucosa, status post passage of a 56      [place] Maloney dilator.  2.  Normal gastric mucosa.  3.  Normal D1, D2.   RECOMMENDATIONS:  1.  Continue Prevacid 30 mg orally daily.  2.  If the patient has recurrent esophageal dysphagia, would consider      esophageal manometry as the next step in her evaluation.     [name]   RMR/MEDQ  D:  05/05/2004  T:  05/05/2004  Job:  [id number]   cc:   Jake Church  [address]  [place]  [place] [address]  Fax: [phone]

## 2010-12-12 NOTE — Op Note (Signed)
NAME:  Shelia [name], Shelia [name]                       ACCOUNT NO.:  [id number]   MEDICAL RECORD NO.:  [id number]                   PATIENT TYPE:  AMB   LOCATION:  DAY                                  FACILITY:  [facility]   PHYSICIAN:  R. Roetta Sessions, M.D.              DATE OF BIRTH:  January 23, 1936   DATE OF PROCEDURE:  10/11/2003  DATE OF DISCHARGE:                                 OPERATIVE REPORT   PROCEDURE:  Left-sided colonoscopy.   ENDOSCOPIST:  Gerrit Friends. Rourk, M.D.   INDICATIONS FOR PROCEDURE:  The patient is [name] with  refractory diarrhea.  She has been extensively evaluated without [name] cause  being found.  She had [name] colonoscopy last year, but it was not for diarrhea  since we have not really gotten to the bottom of her diarrhea as of yet.  Colonoscopy is now being done to collect fresh stool for microbiology  studies and to assess for microscopic colitis.  This approach has been  discussed, at length, with the patient and the patient's husband in my  office recently.  Please see my dictated H&P for more information.   PROCEDURE NOTE:  O2 saturation, blood pressure, pulse and respirations were  monitored throughout the entirety of the procedure.  Conscious sedation:  Versed 3 mg IV, Demerol 75 mg IV in divided doses.   INSTRUMENT:  Olympus pediatric colonoscope.   FINDINGS:  Digital rectal exam revealed no abnormalities.   ENDOSCOPIC FINDINGS:  The prep was excellent.   RECTUM:  Examination of the rectal mucosa including the retroflex view of  the anal verge revealed no abnormalities.   COLON:  The colonic mucosa was surveyed from the rectosigmoid junction up to  40 cm.  The scope was advanced in [name] nice 1:1 fashion.  There was an acute  bend at 40 cm which made the advancement of the scope more difficult.  This  was where we ran into difficulties during her prior colonoscopy.  She had  sigmoid descending diverticula.  The remaining of the colonic mucosa, to 40  cm,  appeared normal.  I elected not to try to go any further and took  segmental biopsies of the descending sigmoid and rectal mucosa to check for  microscopic colitis.  Also, fresh stool residue was suctioned for  microbiology studies.   The patient tolerated the procedure well and was reacted in endoscopy.   IMPRESSION:  1. Normal rectum.  2. Sigmoid descending diverticula.  3. The remainder of the colonic mucosa to 40 cm appeared normal.  4. Segmental biopsies and stool sample taken.   RECOMMENDATIONS:  1. Add Lomotil 1 tablet orally b.i.d. p.r.n. diarrhea to her regimen.  2. Follow up on biopsies and stool sample.  3. Further recommendations to follow.      ___________________________________________  Jonathon Bellows, M.D.   RMR/MEDQ  D:  10/11/2003  T:  10/11/2003  Job:  [id number]   cc:   Jake Church  [address]  [place]  [place] [address]  Fax: [phone]   R. Roetta Sessions, M.D.  [address]  [place]  [place] [address]  Fax: [phone]

## 2010-12-12 NOTE — H&P (Signed)
Shelia [name], [name]                         ACCOUNT NO.:  [id number]   MEDICAL RECORD NO.:  [id number]                   PATIENT TYPE:   LOCATION:                                       FACILITY:   PHYSICIAN:  R. Roetta Sessions, M.D.              DATE OF BIRTH:  1936/03/06   DATE OF ADMISSION:  DATE OF DISCHARGE:                                HISTORY & PHYSICAL   CHIEF COMPLAINT:  Recurrent esophageal dysphagia.   Shelia [name] is a pleasant 75 year old Caucasian female with history  of erosive reflux esophagitis with peptic stricture with secondary dysphagia  requiring EGD with Shelia [name] dilation back on Nov 30, 2002 at [facility]  She had a concomitant food impaction that is described above.  She also underwent a colonoscopy which revealed internal hemorrhoids, a long  tortuous colon, with left-sided diverticula.  She was started on Aciphex 20  mg orally daily.  She has not had any reflux symptoms; however, over the  past couple of weeks she has developed recurrent esophageal dysphagia.  Also, she has had significant constipation and she was started on low-dose  Zelnorm 60 mg daily.  This has been associated with marked improvement in  bowel function, having on the order of three large, soft bowel movements  weekly.  She has not had any other intercurrent medical problems since being  seen by me.   She tells me that she remembered quite a bit of the procedure last time in  spite of receiving Versed 8 mg and Demerol 200 mg.   PAST MEDICAL HISTORY:  1. History of fibromyalgia.  2. History of TIAs.  3. History of anemia and arthritis.   PAST SURGERY:  1. Appendectomy.  2. Hysterectomy.  3. Bladder repair x2.  4. Abdominal wall reconstruction.  5. Cholecystectomy.  6. Biopsy right breast benign disease.   TSH and calcium level were previously determined to be normal.   CURRENT MEDICATIONS:  1. Amitriptyline 100 mg at bedtime.  2. Plavix 75 mg.  3.  Persantine 25 mg daily.  4. Aspirin 325 mg daily.  5. Synthroid 88 mcg daily.  6. Nitrostat 0.4 mg p.r.n.  7. Clonazepam 0.5 mg t.i.d.  8. Zelnorm 6 mg once daily.  9. Aciphex 20 mg daily.   FAMILY AND SOCIAL HISTORY:  Her father died with prostate cancer.  Mother  succumbed to old age.  No history of chronic GI or liver disease.  The  patient has been married for 45 years, has six children.  She is a  housewife.   REVIEW OF SYSTEMS:  No recent chest pain, no fever or chills, change in  weight.   PHYSICAL EXAMINATION:  GENERAL:  Reveals a pleasant 75 year old lady resting  comfortably.  VITAL SIGNS:  Weight 171, blood pressure 124/62, pulse 86.  SKIN:  Warm and dry.  CHEST:  Lungs are clear to auscultation.  CARDIAC:  Regular rate and rhythm without murmur, gallop, rub.  ABDOMEN:  Nondistended, positive bowel sounds.  Soft and nontender without  appreciable mass, organomegaly.   ASSESSMENT:  Shelia [name] is a pleasant 75 year old lady with  history of peptic stricture with secondary dysphagia requiring dilation  earlier this year.  She has now developed recurrent esophageal dysphagia.  I  suspect the ring has tightened up in spite of taking Aciphex daily.  She  really needs to have a repeat EGD with possibly a larger bore dilation.  Her  constipation has responded nicely to low-dose Zelnorm.   RECOMMENDATIONS:  EGD with probable esophageal dilation at [facility] in the very near future.  I have discussed the potential risks,  benefits, alternatives with Shelia [name].  Further recommendations to follow  after EGD has been performed.  She is to continue low-dose Zelnorm at a dose  of 6 mg once daily.                                               Jonathon Bellows, M.D.    RMR/MEDQ  D:  03/29/2003  T:  03/29/2003  Job:  [id number]   cc:   Shelia [name]  [address]  [place]  [place] [address]  Fax: [phone]

## 2010-12-12 NOTE — Op Note (Signed)
NAMESHADOW, STIGGERS             ACCOUNT NO.:  [id number]   MEDICAL RECORD NO.:  [id number]          PATIENT TYPE:  AMB   LOCATION:  DAY                           FACILITY:  [facility]   PHYSICIAN:  R. Roetta Sessions, M.D. DATE OF BIRTH:  1936-02-24   DATE OF PROCEDURE:  09/09/2004  DATE OF DISCHARGE:                                 OPERATIVE REPORT   PROCEDURE:  Esophagogastroduodenoscopy with Elease Hashimoto dilation.   INDICATIONS FOR PROCEDURE:  The patient is 75 year old lady with recurrent  esophageal dysphagia. Back on May 05, 2004 she underwent an EGD which  revealed basically normal upper GI track. A 56-French Maloney dilator was  passed. This was done for esophageal dysphagia. She had a prior barium pill  esophagram which revealed some narrowing at the EG junction ____________  passage of the pill. EGD is now being done with intent for esophageal  dilation as appropriate. This approach has been discussed with patient at  length. Potential risks, benefits, and alternatives have been reviewed.  Please see documentation in the medical record.   PROCEDURE:  O2 saturation, blood pressure, pulse, and respirations were  monitored throughout the entire procedure. Conscious sedation with Versed 5  mg IV and Demerol 100 mg in divided doses.   INSTRUMENT:  Olympus video chip system.   FINDINGS:  Examination of the tubular esophagus revealed no mucosal  abnormalities. EG junction was easily transversed with the scope. I did not  identify any ring or stricture, web, etc.   Stomach:  The gastric cavity was empty and insufflated well with air.  Thorough examination of the gastric mucosal including retroflexed view of  the proximal stomach and esophagogastric junction demonstrated no  abnormalities. Pylorus was patent and easily transversed. Examination of  bulb and second portion revealed no abnormalities.   THERAPY/DIAGNOSTIC MANEUVERS:  A 58-French Maloney dilator is passed fully  without  resistance. A look back revealed some blood at the EG junction. No  apparent complication related to passage of the dilator. The patient  tolerated the procedure well and was reactive in endoscopy.   IMPRESSION:  Endoscopically normal appearing esophagus, stomach, D1 and D2  status post passage of 58-French Maloney dilator.   The patient may have an underlying esophageal motility disorder (i.e.  achalasia).   RECOMMENDATIONS:  We will see how she does over the next several months. We  will bring her back in the office in four months for reassessment. If she  develops recurrent esophageal dysphagia, the next step will be an esophageal  manometry.      RMR/MEDQ  D:  09/09/2004  T:  09/09/2004  Job:  [id number]

## 2010-12-12 NOTE — Op Note (Signed)
NAME:  Shelia, D[place]en[place] Meg Shelia [name]                       ACCOUNT NO.:  [id number]   MEDICAL RECORD NO.:  [id number]                   PATIENT TYPE:  AMB   LOCATION:  DAY                                  FACILITY:  [facility]   PHYSICIAN:  R. Roetta Sessions, M.D.              DATE OF BIRTH:  March 07, 1936   DATE OF PROCEDURE:  11/30/2002  DATE OF DISCHARGE:                                 OPERATIVE REPORT   PROCEDURE:  Esophagogastroduodenoscopy with removal of food impaction and  Maloney dilation followed by diagnostic colonoscopy.   ENDOSCOPIST:  Gerrit Friends. Rourk, M.D.   INDICATIONS FOR PROCEDURE:  The patient is Shelia [name] with  constipation, irritable bowel syndrome, intermittent hematochezia, reflux  symptoms, and esophageal dysphagia.  She is referred for further evaluation.  She is undergoing  EGD and diagnostic colonoscopy.  This  approach has been discussed with the  patient previously.  The potential risks, benefits, and alternatives have  been reviewed; questions answered; she is agreeable.  Please see my  11/23/2002 consultation note for more information.   MONITORING:  O2 saturation, blood pressure, pulse and respirations were  monitored throughout the entire procedures.  Conscious sedation: Versed 8 mg  IV, Demerol 200 mg IV in divided doses, Cetacaine spray for topical  oropharyngeal anesthesia.   INSTRUMENT:  Olympus video chip adult gastroscope as well as the adult and  pediatric colonoscopes.   EGD FINDINGS:  Examination of the tubular esophagus revealed what appeared Shelia [name]  food bolus bivalving up-and-down the distal esophagus.  It was easily pushed  through to the stomach.  The distal esophageal mucosa was macerated and  there appeared to be Shelia [name] soft, benign-appearing, peptic stricture present.  There was no obvious Barrett's esophagus or cancer.  The EG junction was  easily traversed.   STOMACH:  The gastric cavity was empty.  It insufflated well with air.  Shelia [name]  thorough  examination of the gastric mucosa including Shelia [name] retroflex view of the  proximal stomach and esophagogastric junction demonstrated no abnormalities.  The pylorus was patent and easily traversed.   DUODENUM:  The bulb and the second portion appeared normal.   THERAPEUTIC/DIAGNOSTIC MANEUVERS:  Shelia [name] 58 French Maloney dilator was passed to  full insertion with good patient tolerance.  Shelia [name] look back revealed the  stricture had been dilated without apparent complications.   The patient tolerated the procedure well and was prepared for colonoscopy.  Shelia [name] digital rectal examination revealed no abnormalities.   ENDOSCOPIC FINDINGS:  The prep was good.   RECTUM:  Examination of the rectal mucosa including the retroflex view of  the anal verge revealed only internal hemorrhoids.   COLON:  The colonic mucosa was surveyed from the rectosigmoid junction  through the left transverse and right colon to the area of the appendiceal  orifice, ileocecal valve, and cecum.  It is notable that I initially started  with the adult scope. I was  not able to get beyond 40 cm due to  noncompliance of the colon.  I subsequently removed the scope and obtained Shelia [name]  pediatric colonoscope and was able to advance the scope on around to the  cecum.  The cecum, itself, was somewhat retroflexed and difficult to see,  but I was able to image it and it was seen well and photographed.  The cecum  appeared normal.  The colonic mucosa all the way to the cecum appeared  normal except for left-sided diverticula.  Again, the colon was elongated  and tortuous.   From this level the scope was slowly withdrawn.  All previously mentioned  mucosal surfaces were again seen; no other abnormalities were observed.  The  patient tolerated both procedures and was reacted in endoscopy.   EGD IMPRESSION:  1. Food impaction status post disimpaction as described above.  2. Peptic stricture and distal inflammatory changes consistent with      longstanding food impaction including an element of reflux esophagitis,     status post dilation as described above.  3. Normal stomach.  4. Normal D1 and D2.   COLONOSCOPY FINDINGS:  1. Internal hemorrhoids otherwise normal rectum.  2. Long tortuous colon, left-sided diverticula.   DISCUSSION:  I suspect that the patient bleed from hemorrhoids.   RECOMMENDATIONS:  1. Begin Aciphex 20 mg orally daily for gastroesophageal reflux disease.  2. Shelia Shelia [name] has predominance of constipation and has failed basically the     whole gamut of laxatives including MiraLax more recently.  I do not feel     this is Shelia [name] contraindication to Zelnorm.  We talked about Zelnorm.  We will     start on low dose Zelnorm 6 mg orally each morning.  We will plan to see     her back in the office in 1 month.  Should she have any problems, i.e.,     diarrhea, etc. she is to call and let me know in the interim.                                               Jonathon Bellows, M.D.    RMR/MEDQ  D:  11/30/2002  T:  11/30/2002  Job:  [id number]   cc:   Jake Church  [address]  [place]  [place] [address]  Fax: [phone]

## 2011-02-27 ENCOUNTER — Ambulatory Visit: Payer: Medicare Other | Admitting: Internal Medicine

## 2011-03-13 ENCOUNTER — Encounter: Payer: Self-pay | Admitting: Internal Medicine

## 2011-03-13 ENCOUNTER — Ambulatory Visit (INDEPENDENT_AMBULATORY_CARE_PROVIDER_SITE_OTHER): Payer: Medicare Other | Admitting: Internal Medicine

## 2011-03-13 DIAGNOSIS — R1310 Dysphagia, unspecified: Secondary | ICD-10-CM

## 2011-03-13 DIAGNOSIS — K5900 Constipation, unspecified: Secondary | ICD-10-CM

## 2011-03-13 NOTE — Progress Notes (Signed)
Primary Care Physician: Jake Church, MD   75 year old with chronic constipation. States she has not had a bowel movement in 4 days. Takes MiraLax 17 g orally every day.  Colonoscopy last year demonstrated only left-sided diverticula. Also has recurrent esophageal dysphagia solids and pills. Passed a 37 [place] Maloney dilator back in May of this year. She did have some subjective mild narrowing of her distal esophagus without a discrete obstructing lesion seen. She states that the dilation helped for several weeks afterwards but she now has recurrent symptoms. Reflux symptoms well controlled on her current acid suppression regimen.  Chief Complaint  Patient presents with  . Follow-up    having trouble swallowing and keep some things down and no BM in 4 days    HPI: Shelia [name] is a 76 y.o. female here  Current Outpatient Prescriptions  Medication Sig Dispense Refill  . amitriptyline (ELAVIL) 100 MG tablet Take 100 mg by mouth at bedtime.        Marland Kitchen aspirin 325 MG tablet Take 325 mg by mouth daily.        . betamethasone valerate (VALISONE) 0.1 % cream       . clonazePAM (KLONOPIN) 0.5 MG tablet Take 0.5 mg by mouth. TAKE A 1/2 A PILL IN THE AM AND 2 TABLETS IN THE PM       . Cyanocobalamin (B-12 SL) Inject into the skin every 30 (thirty) days.        . diclofenac (VOLTAREN) 0.1 % ophthalmic solution 1 drop 4 (four) times daily.        Marland Kitchen docusate sodium (COLACE) 100 MG capsule Take 100 mg by mouth 2 (two) times daily.        . enalapril (VASOTEC) 2.5 MG tablet Take 2.5 mg by mouth daily.       . ferrous sulfate 325 (65 FE) MG tablet Take 325 mg by mouth daily with breakfast.        . levothyroxine (SYNTHROID, LEVOTHROID) 88 MCG tablet Take 88 mcg by mouth daily.        . methadone (DOLOPHINE) 10 MG tablet Take 10 mg by mouth every 8 (eight) hours.        . nitroGLYCERIN (NITROSTAT) 0.4 MG SL tablet Place 0.4 mg under the tongue as needed.        . pantoprazole (PROTONIX) 40 MG  tablet Take 40 mg by mouth daily.       . polyethylene glycol (MIRALAX / GLYCOLAX) packet Take 17 g by mouth as needed.        . pravastatin (PRAVACHOL) 40 MG tablet Take 40 mg by mouth daily.        . tizanidine (ZANAFLEX) 2 MG capsule Take 2 mg by mouth daily.        Marland Kitchen zolpidem (AMBIEN) 5 MG tablet Take 5 mg by mouth at bedtime as needed.        . enalapril (VASOTEC) 5 MG tablet Take 5 mg by mouth daily.        . meloxicam (MOBIC) 15 MG tablet Take 15 mg by mouth daily.        . ranitidine (ZANTAC) 150 MG capsule Take 150 mg by mouth daily.          Allergies as of 03/13/2011 - Review Complete 03/13/2011  Allergen Reaction Noted  . Duloxetine    . Fluoxetine hcl    . Nalbuphine    . Pregabalin    . Rofecoxib      ROS:  General: Negative for anorexia, weight loss, fever, chills, fatigue, weakness. ENT: Negative for hoarseness, difficulty swallowing , nasal congestion. CV: Negative for chest pain, angina, palpitations, dyspnea on exertion, peripheral edema.  Respiratory: Negative for dyspnea at rest, dyspnea on exertion, cough, sputum, wheezing.  GI: See history of present illness. GU:  Negative for dysuria, hematuria, urinary incontinence, urinary frequency, nocturnal urination.  Endo: Negative for unusual weight change.    Physical Examination:   BP 132/59  Pulse 83  Temp(Src) 97.2 F (36.2 C) (Temporal)  Ht 5\' 4"  (1.626 m)  Wt 161 lb 12.8 oz (73.392 kg)  BMI 27.77 kg/m2  General: Well-nourished, well-developed in no acute distress.  Eyes: No icterus. Mouth: Oropharyngeal mucosa moist and pink , no lesions erythema or exudate. Lungs: Clear to auscultation bilaterally.  Heart: Regular rate and rhythm, no murmurs rubs or gallops.  Abdomen:  Nondistended. Well-healed vertical surgical scar. Bowel sounds are normal, nontender, nondistended, no hepatosplenomegaly or masses, no abdominal bruits or hernia , no rebound or guarding.   Extremities: No lower extremity edema. No  clubbing or deformities. Neuro: Alert and oriented x 4   Skin: Warm and dry, no jaundice.   Psych: Alert and cooperative, normal mood and affect.  Labs:    Imaging Studies: No results found.

## 2011-03-13 NOTE — Progress Notes (Signed)
Cc to PCP 

## 2011-03-13 NOTE — Patient Instructions (Signed)
Movie Prep as directed to clean your colon out as afternoon and tomorrow morning ( samples provided).  After Movie Prep, begin MiraLax 17 g orally twice daily for a bowel movement at least daily to every other day.  I am going to schedule a barium x-ray of your esophagus to further evaluate your swallowing difficulties.

## 2011-03-18 ENCOUNTER — Ambulatory Visit (HOSPITAL_COMMUNITY)
Admission: RE | Admit: 2011-03-18 | Discharge: 2011-03-18 | Disposition: A | Discharge status: Home / Self Care | Payer: Medicare Other | Source: Ambulatory Visit | Attending: Internal Medicine | Admitting: Internal Medicine

## 2011-03-18 DIAGNOSIS — K222 Esophageal obstruction: Secondary | ICD-10-CM | POA: Insufficient documentation

## 2011-03-18 DIAGNOSIS — K225 Diverticulum of esophagus, acquired: Secondary | ICD-10-CM | POA: Insufficient documentation

## 2011-03-18 DIAGNOSIS — R1319 Other dysphagia: Secondary | ICD-10-CM | POA: Insufficient documentation

## 2011-03-24 ENCOUNTER — Other Ambulatory Visit: Payer: Self-pay | Admitting: Internal Medicine

## 2011-03-24 DIAGNOSIS — R1310 Dysphagia, unspecified: Secondary | ICD-10-CM

## 2011-03-24 DIAGNOSIS — K219 Gastro-esophageal reflux disease without esophagitis: Secondary | ICD-10-CM

## 2011-03-24 NOTE — Progress Notes (Signed)
Quick Note:  Pt is scheduled for 04/01/11- I spoke with pts husband, ______

## 2011-03-31 MED ORDER — SODIUM CHLORIDE 0.45 % IV SOLN
Freq: Once | INTRAVENOUS | Status: AC
  Administered 2011-04-01: 1000 mL via INTRAVENOUS

## 2011-04-01 ENCOUNTER — Encounter (HOSPITAL_COMMUNITY): Payer: Self-pay | Admitting: *Deleted

## 2011-04-01 ENCOUNTER — Encounter (HOSPITAL_COMMUNITY)
Admission: RE | Disposition: A | Discharge status: Home / Self Care | Payer: Self-pay | Source: Ambulatory Visit | Attending: Internal Medicine

## 2011-04-01 ENCOUNTER — Ambulatory Visit (HOSPITAL_COMMUNITY)
Admission: RE | Admit: 2011-04-01 | Discharge: 2011-04-01 | Disposition: A | Discharge status: Home / Self Care | Payer: Medicare Other | Source: Ambulatory Visit | Attending: Internal Medicine | Admitting: Internal Medicine

## 2011-04-01 ENCOUNTER — Other Ambulatory Visit: Payer: Self-pay | Admitting: Internal Medicine

## 2011-04-01 DIAGNOSIS — K2940 Chronic atrophic gastritis without bleeding: Secondary | ICD-10-CM | POA: Insufficient documentation

## 2011-04-01 DIAGNOSIS — Z79899 Other long term (current) drug therapy: Secondary | ICD-10-CM | POA: Insufficient documentation

## 2011-04-01 DIAGNOSIS — R933 Abnormal findings on diagnostic imaging of other parts of digestive tract: Secondary | ICD-10-CM

## 2011-04-01 DIAGNOSIS — R1310 Dysphagia, unspecified: Secondary | ICD-10-CM

## 2011-04-01 DIAGNOSIS — Z7982 Long term (current) use of aspirin: Secondary | ICD-10-CM | POA: Insufficient documentation

## 2011-04-01 HISTORY — PX: ESOPHAGOGASTRODUODENOSCOPY: SHX5428

## 2011-04-01 SURGERY — EGD (ESOPHAGOGASTRODUODENOSCOPY)
Anesthesia: Moderate Sedation

## 2011-04-01 MED ORDER — MEPERIDINE HCL 100 MG/ML IJ SOLN
INTRAMUSCULAR | Status: AC
  Filled 2011-04-01: qty 1

## 2011-04-01 MED ORDER — BUTAMBEN-TETRACAINE-BENZOCAINE 2-2-14 % EX AERO
INHALATION_SPRAY | CUTANEOUS | Status: DC | PRN
Start: 2011-04-01 — End: 2011-04-01
  Administered 2011-04-01: 1 via TOPICAL

## 2011-04-01 MED ORDER — MEPERIDINE HCL 100 MG/ML IJ SOLN
INTRAMUSCULAR | Status: DC | PRN
  Administered 2011-04-01: 50 mg via INTRAVENOUS
  Administered 2011-04-01: 25 mg via INTRAVENOUS

## 2011-04-01 MED ORDER — MIDAZOLAM HCL 5 MG/5ML IJ SOLN
INTRAMUSCULAR | Status: DC | PRN
  Administered 2011-04-01: 1 mg via INTRAVENOUS
  Administered 2011-04-01: 2 mg via INTRAVENOUS

## 2011-04-01 MED ORDER — MIDAZOLAM HCL 5 MG/5ML IJ SOLN
INTRAMUSCULAR | Status: AC
  Filled 2011-04-01: qty 5

## 2011-04-01 MED ORDER — PANTOPRAZOLE SODIUM 40 MG PO TBEC: 40.0000 mg | DELAYED_RELEASE_TABLET | Freq: Two times a day (BID) | ORAL | Status: DC

## 2011-04-01 NOTE — H&P (Signed)
Shelia Listen, MD 03/23/2011 2:55 PM Signed  Primary Care Physician: Jake Church, MD  75 year old with chronic constipation. States she has not had a bowel movement in 4 days. Takes MiraLax 17 g orally every day. Colonoscopy last year demonstrated only left-sided diverticula. Also has recurrent esophageal dysphagia solids and pills. Passed a 72 [place] Maloney dilator back in May of this year. She did have some subjective mild narrowing of her distal esophagus without a discrete obstructing lesion seen. She states that the dilation helped for several weeks afterwards but she now has recurrent symptoms. Reflux symptoms well controlled on her current acid suppression regimen.  Chief Complaint   Patient presents with   .  Follow-up     having trouble swallowing and keep some things down and no BM in 4 days    HPI: Shelia [name] is a 75 y.o. female here  Current Outpatient Prescriptions   Medication  Sig  Dispense  Refill   .  amitriptyline (ELAVIL) 100 MG tablet  Take 100 mg by mouth at bedtime.     Marland Kitchen  aspirin 325 MG tablet  Take 325 mg by mouth daily.     .  betamethasone valerate (VALISONE) 0.1 % cream      .  clonazePAM (KLONOPIN) 0.5 MG tablet  Take 0.5 mg by mouth. TAKE A 1/2 A PILL IN THE AM AND 2 TABLETS IN THE PM     .  Cyanocobalamin (B-12 SL)  Inject into the skin every 30 (thirty) days.     .  diclofenac (VOLTAREN) 0.1 % ophthalmic solution  1 drop 4 (four) times daily.     Marland Kitchen  docusate sodium (COLACE) 100 MG capsule  Take 100 mg by mouth 2 (two) times daily.     .  enalapril (VASOTEC) 2.5 MG tablet  Take 2.5 mg by mouth daily.     .  ferrous sulfate 325 (65 FE) MG tablet  Take 325 mg by mouth daily with breakfast.     .  levothyroxine (SYNTHROID, LEVOTHROID) 88 MCG tablet  Take 88 mcg by mouth daily.     .  methadone (DOLOPHINE) 10 MG tablet  Take 10 mg by mouth every 8 (eight) hours.     .  nitroGLYCERIN (NITROSTAT) 0.4 MG SL tablet  Place 0.4 mg under the tongue as needed.       .  pantoprazole (PROTONIX) 40 MG tablet  Take 40 mg by mouth daily.     .  polyethylene glycol (MIRALAX / GLYCOLAX) packet  Take 17 g by mouth as needed.     .  pravastatin (PRAVACHOL) 40 MG tablet  Take 40 mg by mouth daily.     .  tizanidine (ZANAFLEX) 2 MG capsule  Take 2 mg by mouth daily.     Marland Kitchen  zolpidem (AMBIEN) 5 MG tablet  Take 5 mg by mouth at bedtime as needed.     .  enalapril (VASOTEC) 5 MG tablet  Take 5 mg by mouth daily.     .  meloxicam (MOBIC) 15 MG tablet  Take 15 mg by mouth daily.     .  ranitidine (ZANTAC) 150 MG capsule  Take 150 mg by mouth daily.      Allergies as of 03/13/2011 - Review Complete 03/13/2011   Allergen  Reaction  Noted   .  Duloxetine     .  Fluoxetine hcl     .  Nalbuphine     .  Pregabalin     .  Rofecoxib      ROS:  General: Negative for anorexia, weight loss, fever, chills, fatigue, weakness.  ENT: Negative for hoarseness, difficulty swallowing , nasal congestion.  CV: Negative for chest pain, angina, palpitations, dyspnea on exertion, peripheral edema.  Respiratory: Negative for dyspnea at rest, dyspnea on exertion, cough, sputum, wheezing.  GI: See history of present illness.  GU: Negative for dysuria, hematuria, urinary incontinence, urinary frequency, nocturnal urination.  Endo: Negative for unusual weight change.  Physical Examination:  BP 132/59  Pulse 83  Temp(Src) 97.2 F (36.2 C) (Temporal)  Ht 5\' 4"  (1.626 m)  Wt 161 lb 12.8 oz (73.392 kg)  BMI 27.77 kg/m2  General: Well-nourished, well-developed in no acute distress.  Eyes: No icterus.  Mouth: Oropharyngeal mucosa moist and pink , no lesions erythema or exudate.  Lungs: Clear to auscultation bilaterally.  Heart: Regular rate and rhythm, no murmurs rubs or gallops.  Abdomen: Nondistended. Well-healed vertical surgical scar. Bowel sounds are normal, nontender, nondistended, no hepatosplenomegaly or masses, no abdominal bruits or hernia , no rebound or guarding.  Extremities:  No lower extremity edema. No clubbing or deformities.  Neuro: Alert and oriented x 4  Skin: Warm and dry, no jaundice.  Psych: Alert and cooperative, normal mood and affect.  Labs:  Imaging Studies:     I have seen the patient prior to the procedure(s) today and reviewed the history and physical / consultation from 03/23/11.  There have been no changes. After consideration of the risks, benefits, alternatives and imponderables, the patient has consented to the procedure(s).   Patient underwent a barium pill esophagram. Tiny Zenkers. Barium pill, however, pill hung at the EG junction. Constipation better with colonic purge and regular use of MiraLax.  EGD now being performed with plans to be dilate her esophagus to a relatively large bore with [facility] dilators.Risks, benefits, limitations, alternatives and imponderables have been reviewed with the patient. Potential for esophageal dilation, biopsy etc. Have also been reviewed.  Questions have been answered. All parties agreeable.

## 2011-04-03 ENCOUNTER — Encounter: Payer: Self-pay | Admitting: Internal Medicine

## 2011-04-06 ENCOUNTER — Encounter: Payer: Self-pay | Admitting: Internal Medicine

## 2011-04-07 ENCOUNTER — Telehealth: Payer: Self-pay

## 2011-04-07 NOTE — Telephone Encounter (Signed)
Pt's husband called and said Dr. Jena Gauss increased Protonix to bid when he did her endoscopy last week. Need new prescription sent to [facility]. Also, he said that Dr. Jena Gauss had on her discharge papers from the hospital that pt should stop Mobic and stop Vasotec. He just wants to know why she should stop the Vasotec. Please advise!

## 2011-04-08 ENCOUNTER — Encounter (HOSPITAL_COMMUNITY): Payer: Self-pay | Admitting: Internal Medicine

## 2011-04-09 NOTE — Telephone Encounter (Signed)
I have not recommended she stop Vasotec. She should follow instructions of her primary care physician or the prescribing doctor as far as Shelia [name] is concerned. I would minimize the use of Mobic as this could hurt the stomach lining

## 2011-04-09 NOTE — Telephone Encounter (Signed)
Pt and pts husband aware.

## 2011-10-19 ENCOUNTER — Encounter: Payer: Self-pay | Admitting: Internal Medicine

## 2012-02-22 ENCOUNTER — Telehealth (INDEPENDENT_AMBULATORY_CARE_PROVIDER_SITE_OTHER): Payer: Self-pay | Admitting: *Deleted

## 2012-02-22 NOTE — Telephone Encounter (Signed)
Spoke to [employer] and made her aware patient sees Dr Jena Gauss

## 2012-02-22 NOTE — Telephone Encounter (Signed)
Call from Friday [phone] @2 :12 Kim with Dr. Sharlyn Bologna  Return call # [phone] Appt scheduled with  Dr. Karilyn Cota more problems [phone]  (I did try myself to call this morning to leave a message but their phone only leaves you a message and does not take messages) She is Dr. Jena Gauss patient)

## 2012-04-11 ENCOUNTER — Other Ambulatory Visit: Payer: Self-pay

## 2012-04-11 MED ORDER — PANTOPRAZOLE SODIUM 40 MG PO TBEC: 40.0000 mg | DELAYED_RELEASE_TABLET | Freq: Every day | ORAL | Status: DC

## 2012-11-29 IMAGING — RF DG ESOPHAGUS
17 of 24 series · 17 of 24 positions shown · non-contrast
Comparison: None

CLINICAL DATA: Dysphagia with solids and liquids, history of
esophageal stricture post dilatation September 2010

BARIUM SWALLOW/ESOPHAGRAM:
TECHNIQUE: Single contrast, air contrast, and tablet imaging of
the esophagus were performed.
Fluoroscopy time:  3 minutes

[Series 1: run · 1 of 1 slices shown (1 of 17)]
[im 1/1]
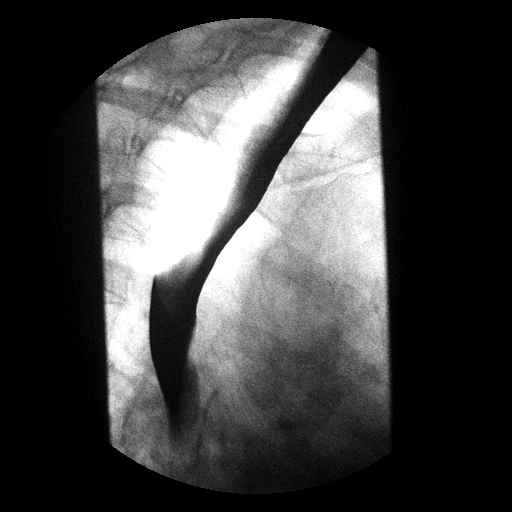

[Series 3: run · 1 of 1 slices shown (2 of 17)]
[im 1/1]
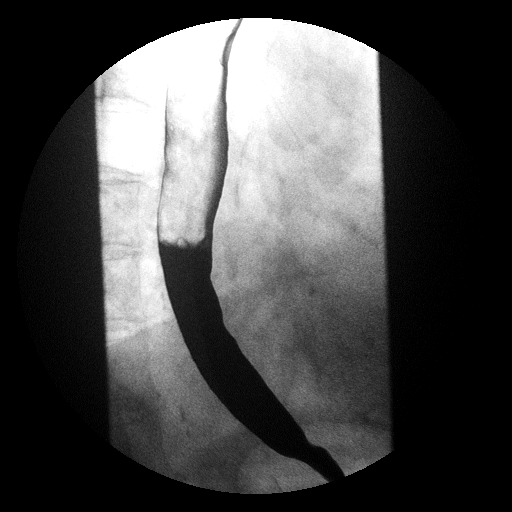

[Series 4: run · 1 of 1 slices shown (3 of 17)]
[im 1/1]
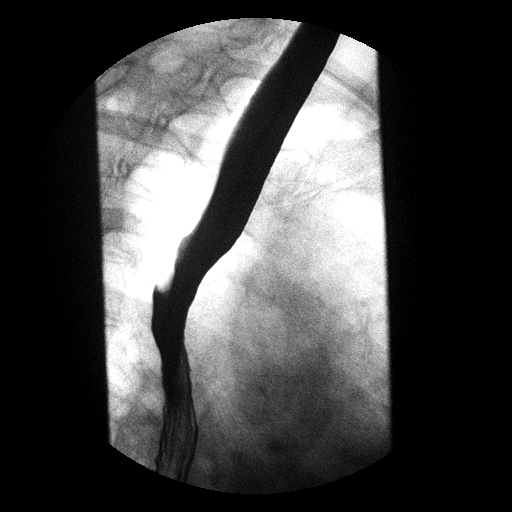

[Series 5: run · 1 of 1 slices shown (4 of 17)]
[im 1/1]
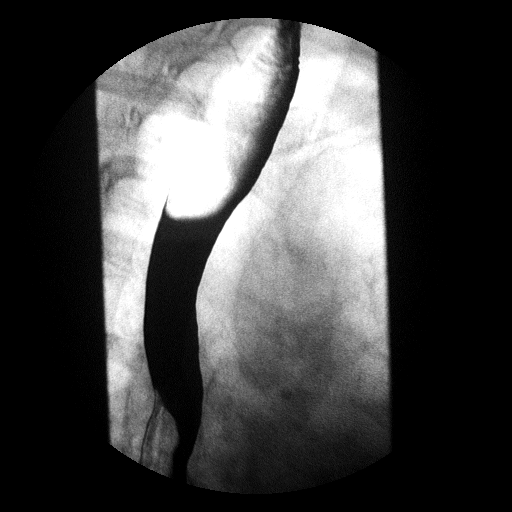

[Series 7: run · 1 of 1 slices shown (5 of 17)]
[im 1/1]
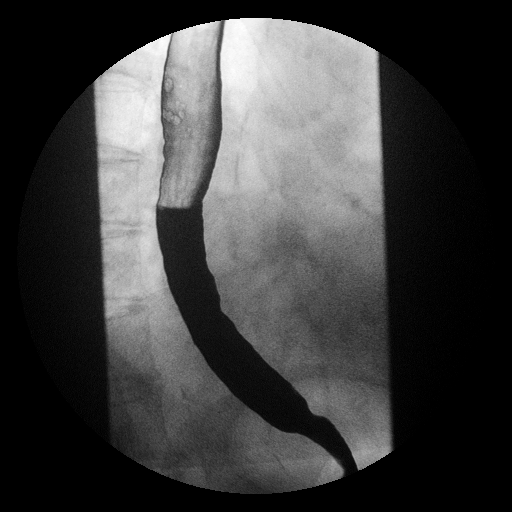

[Series 8: run · 1 of 1 slices shown (6 of 17)]
[im 1/1]
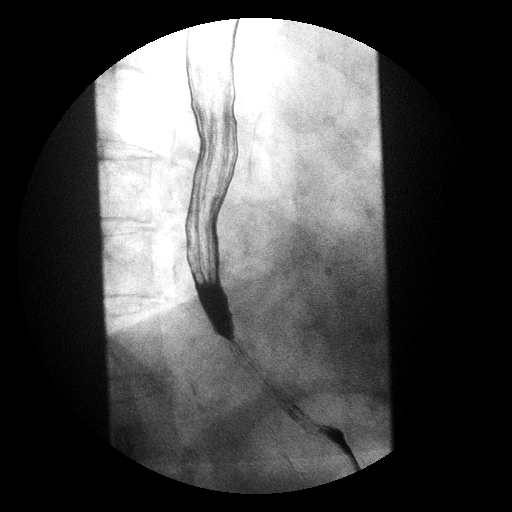

[Series 10: run · 1 of 1 slices shown (7 of 17)]
[im 1/1]
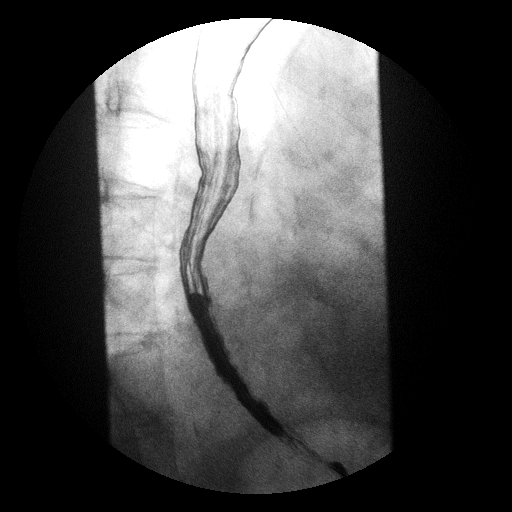

[Series 11: run · 1 of 1 slices shown (8 of 17)]
[im 1/1]
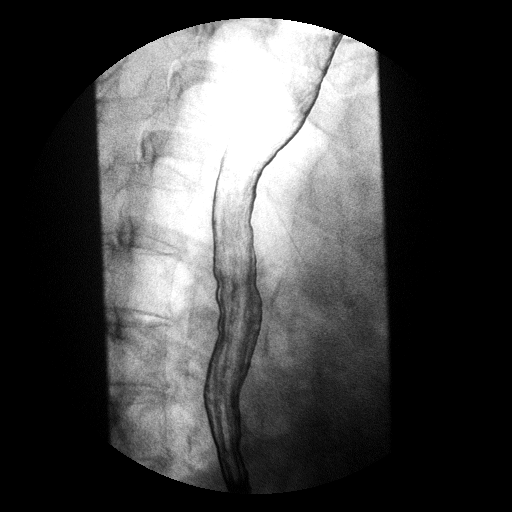

[Series 13: run · 1 of 1 slices shown (9 of 17)]
[im 1/1]
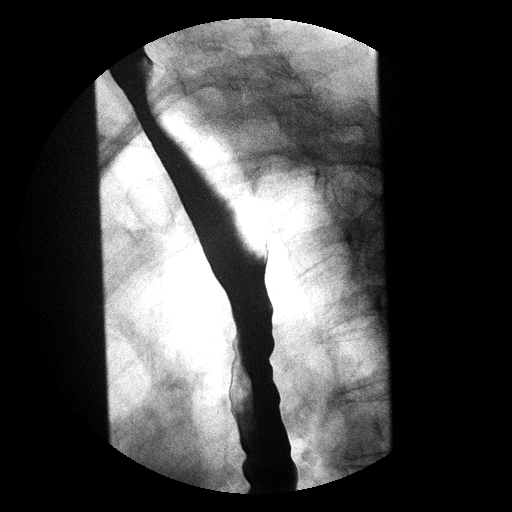

[Series 14: run · 1 of 1 slices shown (10 of 17)]
[im 1/1]
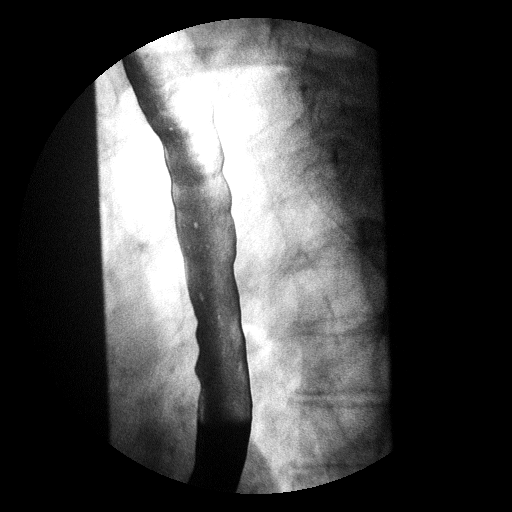

[Series 15: run · 1 of 1 slices shown (11 of 17)]
[im 1/1]
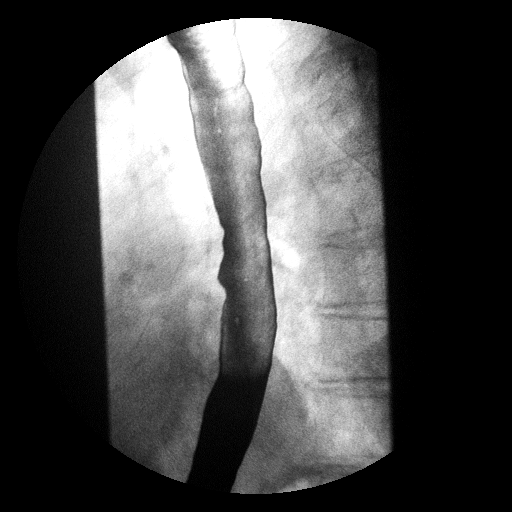

[Series 17: run · 1 of 1 slices shown (12 of 17)]
[im 1/1]
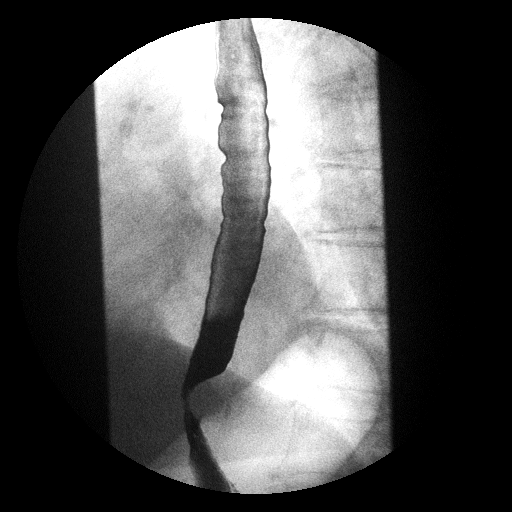

[Series 18: run · 1 of 1 slices shown (13 of 17)]
[im 1/1]
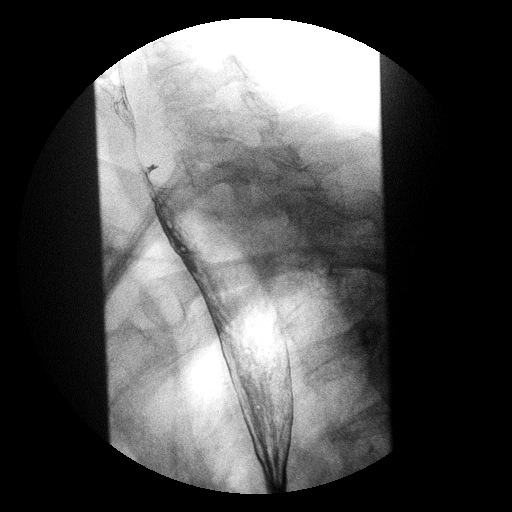

[Series 20: run · 1 of 9 slices shown (14 of 17)]
[im 1/9]
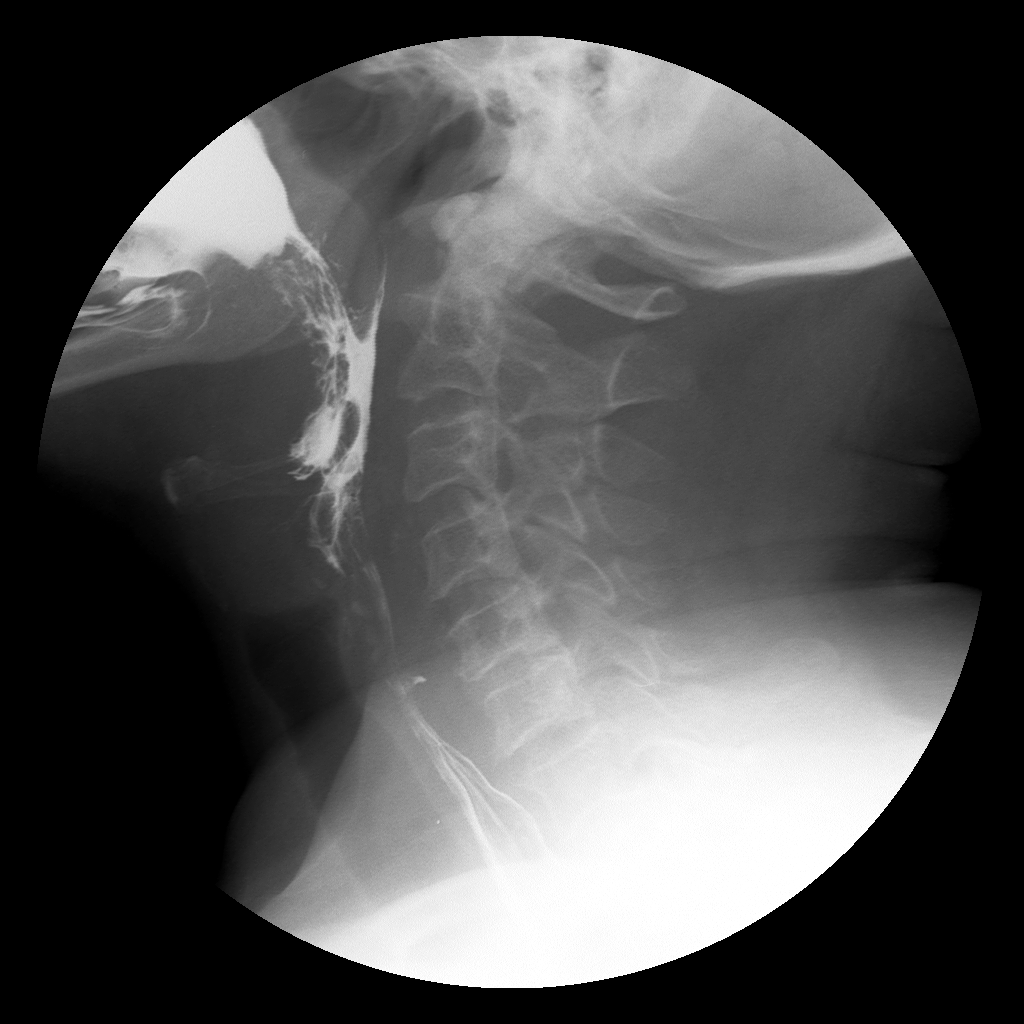

[Series 21: run · 1 of 1 slices shown (15 of 17)]
[im 1/1]
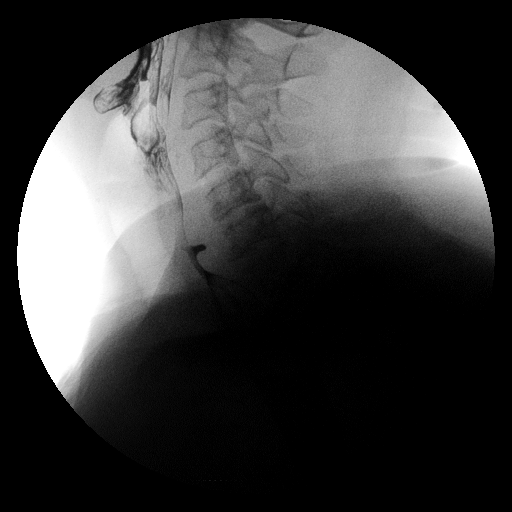

[Series 22: run · 1 of 1 slices shown (16 of 17)]
[im 1/1]
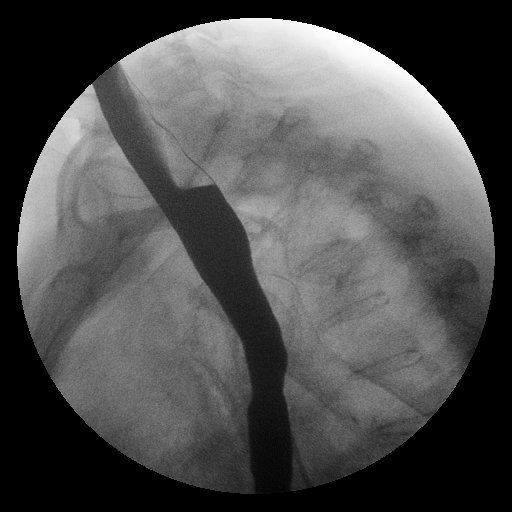

[Series 24: run · 1 of 1 slices shown (17 of 17)]
[im 1/1]
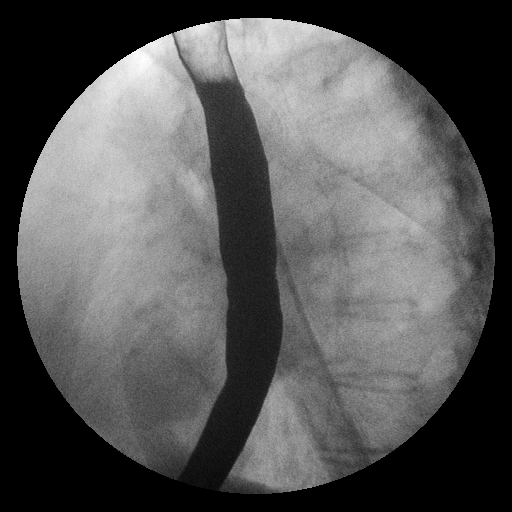

[17 of 24 positions shown; findings below may reference images not displayed]

FINDINGS: Minimal age-related diffuse impairment esophageal motility.
No esophageal mass or persistent intraluminal filling defect.
Tiny Zenker diverticulum identified.
Targeted rapid sequence imaging of the cervical esophagus and
hypopharynx is otherwise normal.
Esophageal mucosa smooth without wall irregularity or ulceration on
air contrast views.
Narrowing of the esophagus is seen at the gastroesophageal
junction, obstructing 12.5 mm diameter barium tablet.
Tablet dissolved in place at this site.
No other areas of esophageal narrowing are identified.
Remainder esophagus distends normally.
IMPRESSION: Tiny Zenker diverticulum.
Stricture at the gastroesophageal junction, obstructing a 12.5 mm
diameter barium tablet.

## 2013-04-12 ENCOUNTER — Other Ambulatory Visit: Payer: Self-pay

## 2013-04-12 MED ORDER — PANTOPRAZOLE SODIUM 40 MG PO TBEC: 40.0000 mg | DELAYED_RELEASE_TABLET | Freq: Every day | ORAL | Status: DC

## 2013-10-10 ENCOUNTER — Other Ambulatory Visit: Payer: Self-pay

## 2013-10-10 MED ORDER — PANTOPRAZOLE SODIUM 40 MG PO TBEC: 40.0000 mg | DELAYED_RELEASE_TABLET | Freq: Every day | ORAL | Status: AC

## 2013-10-19 NOTE — Telephone Encounter (Signed)
Open in error

## 2014-08-15 ENCOUNTER — Other Ambulatory Visit (HOSPITAL_COMMUNITY): Payer: Self-pay | Admitting: Orthopaedic Surgery

## 2014-08-15 DIAGNOSIS — M25562 Pain in left knee: Secondary | ICD-10-CM

## 2014-08-21 ENCOUNTER — Ambulatory Visit (HOSPITAL_COMMUNITY)
Admission: RE | Admit: 2014-08-21 | Discharge: 2014-08-21 | Disposition: A | Discharge status: Home / Self Care | Payer: Medicare Other | Source: Ambulatory Visit | Attending: Orthopaedic Surgery | Admitting: Orthopaedic Surgery

## 2014-08-21 DIAGNOSIS — W1839XA Other fall on same level, initial encounter: Secondary | ICD-10-CM | POA: Diagnosis not present

## 2014-08-21 DIAGNOSIS — S83242A Other tear of medial meniscus, current injury, left knee, initial encounter: Secondary | ICD-10-CM | POA: Insufficient documentation

## 2014-08-21 DIAGNOSIS — Y939 Activity, unspecified: Secondary | ICD-10-CM | POA: Diagnosis not present

## 2014-08-21 DIAGNOSIS — Y92012 Bathroom of single-family (private) house as the place of occurrence of the external cause: Secondary | ICD-10-CM | POA: Diagnosis not present

## 2014-08-21 DIAGNOSIS — M25562 Pain in left knee: Secondary | ICD-10-CM

## 2014-08-21 DIAGNOSIS — R531 Weakness: Secondary | ICD-10-CM | POA: Diagnosis not present

## 2020-03-27 DEATH — deceased
